# Patient Record
Sex: Male | Born: 1970 | Hispanic: No | Marital: Married | Smoking: Light tobacco smoker
Health system: Southern US, Community
[De-identification: ages and names within clinical notes are randomized; demographics above are authoritative.]

## PROBLEM LIST (undated history)

## (undated) HISTORY — PX: APPENDECTOMY: SHX54

---

## 2003-05-28 ENCOUNTER — Ambulatory Visit (HOSPITAL_COMMUNITY): Admission: EM | Admit: 2003-05-28 | Discharge: 2003-05-28 | Payer: Self-pay | Admitting: Emergency Medicine

## 2016-04-27 ENCOUNTER — Emergency Department (HOSPITAL_COMMUNITY)
Admission: EM | Admit: 2016-04-27 | Discharge: 2016-04-27 | Disposition: A | Discharge status: Home / Self Care | Payer: Worker's Compensation | Attending: Emergency Medicine | Admitting: Emergency Medicine

## 2016-04-27 ENCOUNTER — Emergency Department (HOSPITAL_COMMUNITY): Payer: Worker's Compensation

## 2016-04-27 ENCOUNTER — Encounter (HOSPITAL_COMMUNITY): Payer: Self-pay

## 2016-04-27 DIAGNOSIS — S81012A Laceration without foreign body, left knee, initial encounter: Secondary | ICD-10-CM | POA: Insufficient documentation

## 2016-04-27 DIAGNOSIS — S43014A Anterior dislocation of right humerus, initial encounter: Secondary | ICD-10-CM | POA: Insufficient documentation

## 2016-04-27 DIAGNOSIS — Y92410 Unspecified street and highway as the place of occurrence of the external cause: Secondary | ICD-10-CM | POA: Insufficient documentation

## 2016-04-27 DIAGNOSIS — IMO0002 Reserved for concepts with insufficient information to code with codable children: Secondary | ICD-10-CM

## 2016-04-27 DIAGNOSIS — Y9389 Activity, other specified: Secondary | ICD-10-CM | POA: Insufficient documentation

## 2016-04-27 DIAGNOSIS — F17200 Nicotine dependence, unspecified, uncomplicated: Secondary | ICD-10-CM | POA: Insufficient documentation

## 2016-04-27 DIAGNOSIS — Y990 Civilian activity done for income or pay: Secondary | ICD-10-CM | POA: Diagnosis not present

## 2016-04-27 DIAGNOSIS — R52 Pain, unspecified: Secondary | ICD-10-CM

## 2016-04-27 DIAGNOSIS — S4991XA Unspecified injury of right shoulder and upper arm, initial encounter: Secondary | ICD-10-CM | POA: Diagnosis present

## 2016-04-27 DIAGNOSIS — S43004A Unspecified dislocation of right shoulder joint, initial encounter: Secondary | ICD-10-CM

## 2016-04-27 MED ORDER — HYDROCODONE-ACETAMINOPHEN 5-325 MG PO TABS: 1.0000 | ORAL_TABLET | Freq: Four times a day (QID) | ORAL | 0 refills | Status: AC | PRN

## 2016-04-27 MED ORDER — FENTANYL CITRATE (PF) 100 MCG/2ML IJ SOLN
100.0000 ug | Freq: Once | INTRAMUSCULAR | Status: AC
  Administered 2016-04-27: 100 ug via INTRAVENOUS

## 2016-04-27 MED ORDER — PROPOFOL 10 MG/ML IV BOLUS
INTRAVENOUS | Status: AC | PRN
  Administered 2016-04-27: 40.8 mg via INTRAVENOUS

## 2016-04-27 MED ORDER — FENTANYL CITRATE (PF) 100 MCG/2ML IJ SOLN
100.0000 ug | Freq: Once | INTRAMUSCULAR | Status: AC
  Administered 2016-04-27: 100 ug via INTRAVENOUS
  Filled 2016-04-27: qty 2

## 2016-04-27 MED ORDER — IBUPROFEN 800 MG PO TABS: 800.0000 mg | ORAL_TABLET | Freq: Three times a day (TID) | ORAL | 0 refills | Status: AC

## 2016-04-27 MED ORDER — FENTANYL CITRATE (PF) 100 MCG/2ML IJ SOLN
100.0000 ug | Freq: Once | INTRAMUSCULAR | Status: DC
  Filled 2016-04-27: qty 2

## 2016-04-27 MED ORDER — PROPOFOL 10 MG/ML IV BOLUS
0.5000 mg/kg | Freq: Once | INTRAVENOUS | Status: DC
  Filled 2016-04-27: qty 20

## 2016-04-27 NOTE — ED Notes (Signed)
Pt transported to xray 

## 2016-04-27 NOTE — ED Notes (Signed)
Patient transported to X-ray 

## 2016-04-27 NOTE — ED Notes (Signed)
Airway cart at bedside, patient placed on zoll monitoring, end tidal co2 and suction set up in preparation for conscious sedation.
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
## 2016-04-27 NOTE — ED Notes (Signed)
Pt returned from Ct scan. Ct tech unable to perform ct scan due to pt being unable to put shoulder down. Dr. Jeraldine LootsLockwood made aware.

## 2016-04-27 NOTE — ED Notes (Signed)
DR lockwood at bedside to explain procedure, consent signed by patient.

## 2016-04-27 NOTE — ED Notes (Signed)
Pt transported to CT ?
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
## 2016-04-27 NOTE — Sedation Documentation (Signed)
Patient's shoulder reduced and back in place.

## 2016-04-27 NOTE — ED Notes (Signed)
Pt returned from CT, placed back on monitor.

## 2016-04-27 NOTE — ED Notes (Signed)
Pt ambulated in hall with stand-by assist.

## 2016-12-16 ENCOUNTER — Ambulatory Visit (INDEPENDENT_AMBULATORY_CARE_PROVIDER_SITE_OTHER): Payer: Worker's Compensation | Admitting: Emergency Medicine

## 2016-12-16 ENCOUNTER — Ambulatory Visit (INDEPENDENT_AMBULATORY_CARE_PROVIDER_SITE_OTHER): Payer: Worker's Compensation

## 2016-12-16 VITALS — BP 138/92 | HR 84 | Temp 99.2°F | Resp 16 | Ht 64.5 in | Wt 196.8 lb

## 2016-12-16 DIAGNOSIS — Z01818 Encounter for other preprocedural examination: Secondary | ICD-10-CM

## 2016-12-16 NOTE — Patient Instructions (Addendum)
     IF you received an x-ray today, you will receive an invoice from [facility]. Please contact [facility] at [phone] with questions or concerns regarding your invoice.   IF you received labwork today, you will receive an invoice from [facility]. Please contact [facility] at [phone] with questions or concerns regarding your invoice.   Our billing staff will not be able to assist you with questions regarding bills from these companies.  You will be contacted with the lab results as soon as they are available. The fastest way to get your results is to activate your My Chart account. Instructions are located on the last page of this paperwork. If you have not heard from us regarding the results in 2 weeks, please contact this office.      Place preoperative clearance patient instructions here.  

## 2016-12-16 NOTE — Progress Notes (Signed)
Subjective:    Douglas Larsen is a 46 y.o. male who presents to the office today for a preoperative consultation at the request of surgeon  who plans on performing right shoulder surgery on as soon as cleared this month April 2018. This consultation is requested for the specific conditions prompting preoperative evaluation (i.e. because of potential affect on operative risk): Marland Kitchen Planned anesthesia: general. The patient has the following known anesthesia issues: none. Patients bleeding risk: no recent abnormal bleeding. Patient does not have objections to receiving blood products if needed.  The following portions of the patient's history were reviewed and updated as appropriate: allergies, current medications, past family history, past medical history, past social history, past surgical history and problem list.  Review of Systems A comprehensive review of systems was negative.    Objective:    BP (!) 138/92   Pulse 84   Temp 99.2 F (37.3 C) (Oral)   Resp 16   Ht 5' 4.5" (1.638 m)   Wt 196 lb 12.8 oz (89.3 kg)   SpO2 95%   BMI 33.26 kg/m   General Appearance:    Alert, cooperative, no distress, appears stated age  Head:    Normocephalic, without obvious abnormality, atraumatic  Eyes:    PERRL, conjunctiva/corneas clear, EOM's intact, fundi    benign, both eyes       Ears:    Normal TM's and external ear canals, both ears  Nose:   Nares normal, septum midline, mucosa normal, no drainage    or sinus tenderness  Throat:   Lips, mucosa, and tongue normal; teeth and gums normal  Neck:   Supple, symmetrical, trachea midline, no adenopathy;       thyroid:  No enlargement/tenderness/nodules; no carotid   bruit or JVD  Back:     Symmetric, no curvature, ROM normal, no CVA tenderness  Lungs:     Clear to auscultation bilaterally, respirations unlabored  Chest wall:    No tenderness or deformity  Heart:    Regular rate and rhythm, S1 and S2 normal, no murmur, rub   or gallop   Abdomen:     Soft, non-tender, bowel sounds active all four quadrants,    no masses, no organomegaly        Extremities:   Right shoulder: LROM; rest of Extremities normal, atraumatic, no cyanosis or edema  Pulses:   2+ and symmetric all extremities  Skin:   Skin color, texture, turgor normal, no rashes or lesions  Lymph nodes:   Cervical, supraclavicular, and axillary nodes normal  Neurologic:   CNII-XII intact. Normal strength, sensation and reflexes      throughout    Predictors of intubation difficulty:  Morbid obesity? no  Anatomically abnormal facies? no  Prominent incisors? no  Receding mandible? no  Short, thick neck? no  Neck range of motion: normal   Cardiographics  ECG: normal sinus rhythm, no blocks or conduction defects, no ischemic changes Echocardiogram: not done  Imaging Chest x-ray: normal   Lab Review  No results found for: CHOL, TRIG, HDL, LDLDIRECT    Assessment:      46 y.o. male with planned surgery as above.   Known risk factors for perioperative complications: None   Difficulty with intubation is not anticipated.  Cardiac Risk Estimation:low risk  Current medications which may produce withdrawal symptoms if withheld perioperatively: none     Plan:    1. Preoperative workup done and completed. 2. Change in medication regimen before surgery: not  applicable, not on any medications. 3. Prophylaxis for cardiac events with perioperative beta-blockers: not indicated. 4. Invasive hemodynamic monitoring perioperatively: at the discretion of anesthesiologist. 5. Deep vein thrombosis prophylaxis postoperatively:regimen to be chosen by surgical team. 6. Surveillance for postoperative MI with ECG immediately postoperatively and on postoperative days 1 and 2 AND troponin levels 24 hours postoperatively and on day 4 or hospital discharge (whichever comes first): at the discretion of anesthesiologist.

## 2016-12-17 LAB — CBC WITH DIFFERENTIAL/PLATELET
BASOS ABS: 0 10*3/uL (ref 0.0–0.2)
Basos: 0 %
EOS (ABSOLUTE): 1 10*3/uL — AB (ref 0.0–0.4)
Eos: 12 %
HEMOGLOBIN: 16.2 g/dL (ref 13.0–17.7)
Hematocrit: 47.1 % (ref 37.5–51.0)
IMMATURE GRANULOCYTES: 0 %
Immature Grans (Abs): 0 10*3/uL (ref 0.0–0.1)
LYMPHS: 29 %
Lymphocytes Absolute: 2.6 10*3/uL (ref 0.7–3.1)
MCH: 30.3 pg (ref 26.6–33.0)
MCHC: 34.4 g/dL (ref 31.5–35.7)
MCV: 88 fL (ref 79–97)
Monocytes Absolute: 0.6 10*3/uL (ref 0.1–0.9)
Monocytes: 7 %
NEUTROS PCT: 52 %
Neutrophils Absolute: 4.6 10*3/uL (ref 1.4–7.0)
PLATELETS: 319 10*3/uL (ref 150–379)
RBC: 5.34 x10E6/uL (ref 4.14–5.80)
RDW: 14.3 % (ref 12.3–15.4)
WBC: 9 10*3/uL (ref 3.4–10.8)

## 2016-12-17 LAB — COMPREHENSIVE METABOLIC PANEL
ALK PHOS: 75 IU/L (ref 39–117)
ALT: 42 IU/L (ref 0–44)
AST: 25 IU/L (ref 0–40)
Albumin/Globulin Ratio: 1.4 (ref 1.2–2.2)
Albumin: 4.3 g/dL (ref 3.5–5.5)
BUN/Creatinine Ratio: 16 (ref 9–20)
BUN: 14 mg/dL (ref 6–24)
CHLORIDE: 99 mmol/L (ref 96–106)
CO2: 24 mmol/L (ref 18–29)
Calcium: 9.3 mg/dL (ref 8.7–10.2)
Creatinine, Ser: 0.88 mg/dL (ref 0.76–1.27)
GFR calc non Af Amer: 103 mL/min/{1.73_m2} (ref >59)
GFR, EST AFRICAN AMERICAN: 119 mL/min/{1.73_m2} (ref >59)
GLUCOSE: 107 mg/dL — AB (ref 65–99)
Globulin, Total: 3 g/dL (ref 1.5–4.5)
POTASSIUM: 4.6 mmol/L (ref 3.5–5.2)
Sodium: 139 mmol/L (ref 134–144)
TOTAL PROTEIN: 7.3 g/dL (ref 6.0–8.5)

## 2016-12-17 LAB — HEMOGLOBIN A1C
ESTIMATED AVERAGE GLUCOSE: 123 mg/dL
HEMOGLOBIN A1C: 5.9 % — AB (ref 4.8–5.6)

## 2016-12-17 LAB — PROTIME-INR
INR: 1 (ref 0.8–1.2)
Prothrombin Time: 10.2 s (ref 9.1–12.0)

## 2017-01-17 NOTE — Addendum Note (Signed)
Addended by: Evie LacksSAGARDIA, Williom Cedar J on: 01/17/2017 04:49 PM   Modules accepted: Level of Service

## 2018-12-15 IMAGING — DX DG CHEST 2V
2 series · 2 of 2 positions shown · non-contrast
Comparison: None.

CLINICAL DATA: Preop clearance

EXAM:
CHEST  2 VIEW

[chest pa]
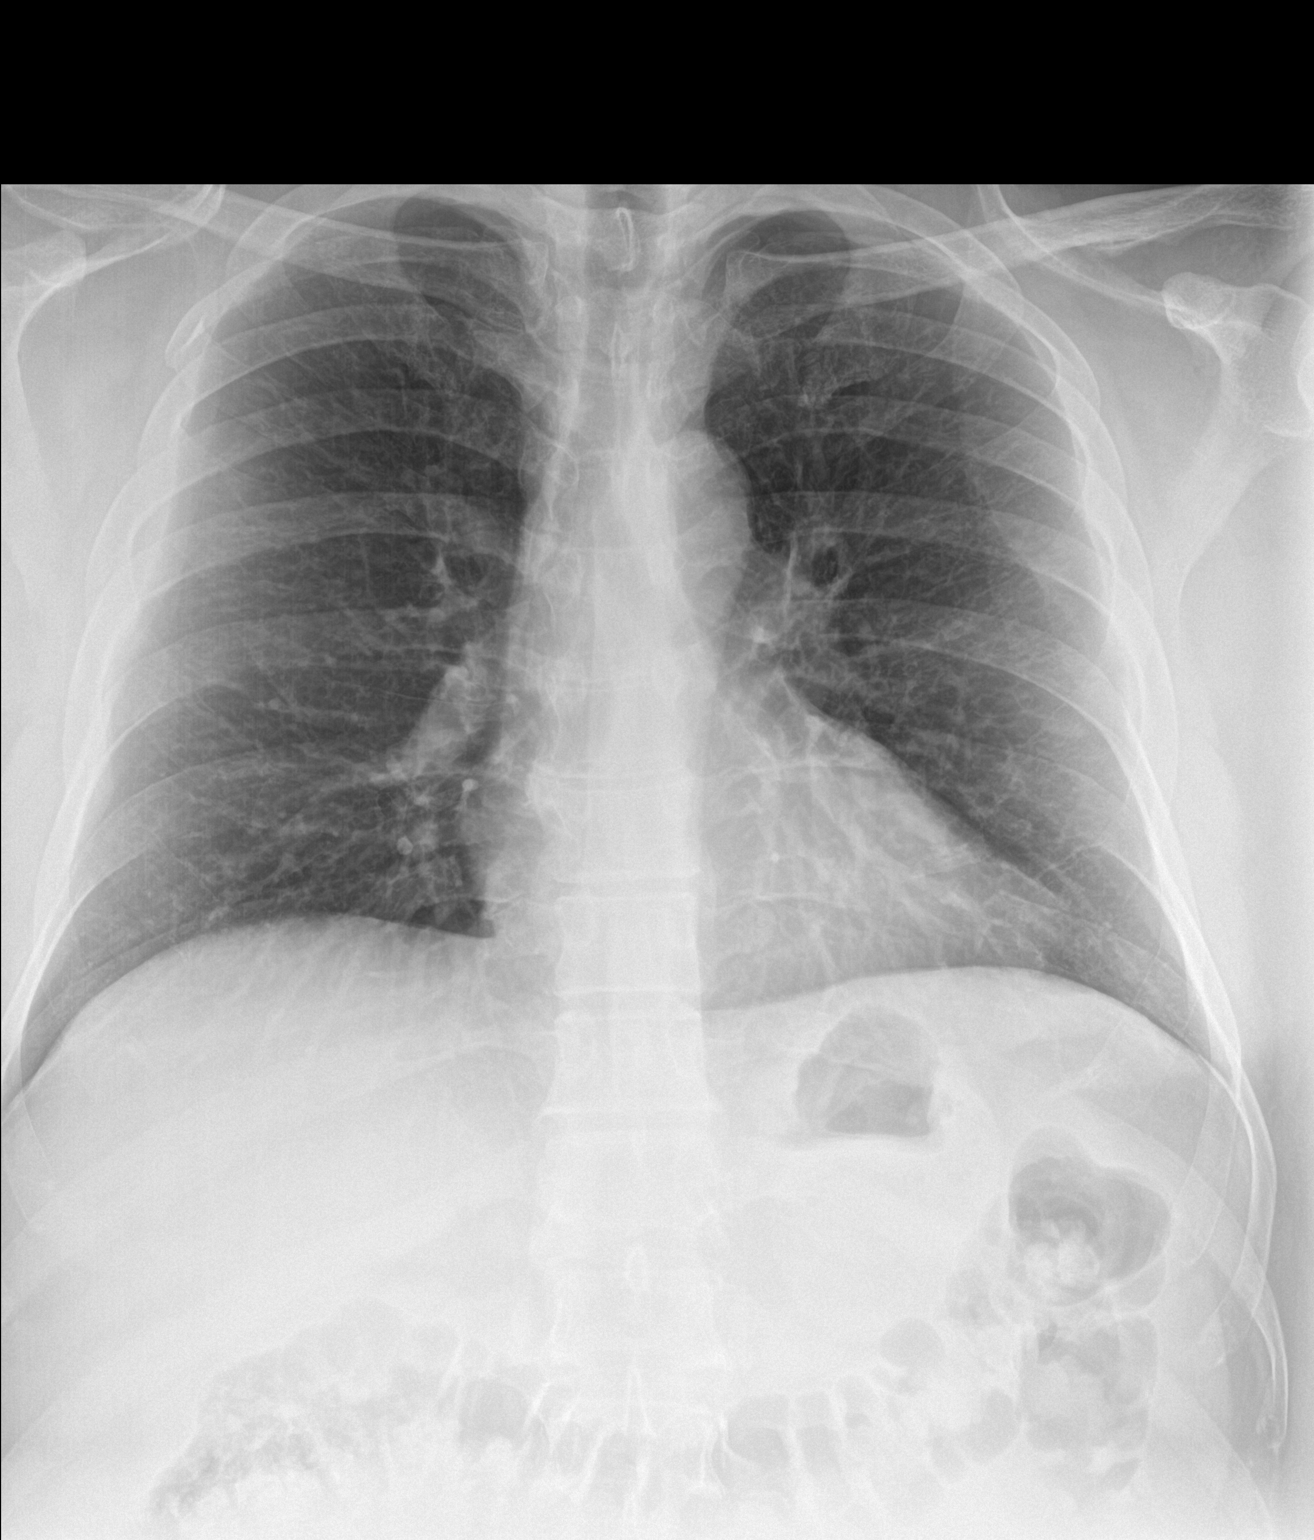

[chest lat]
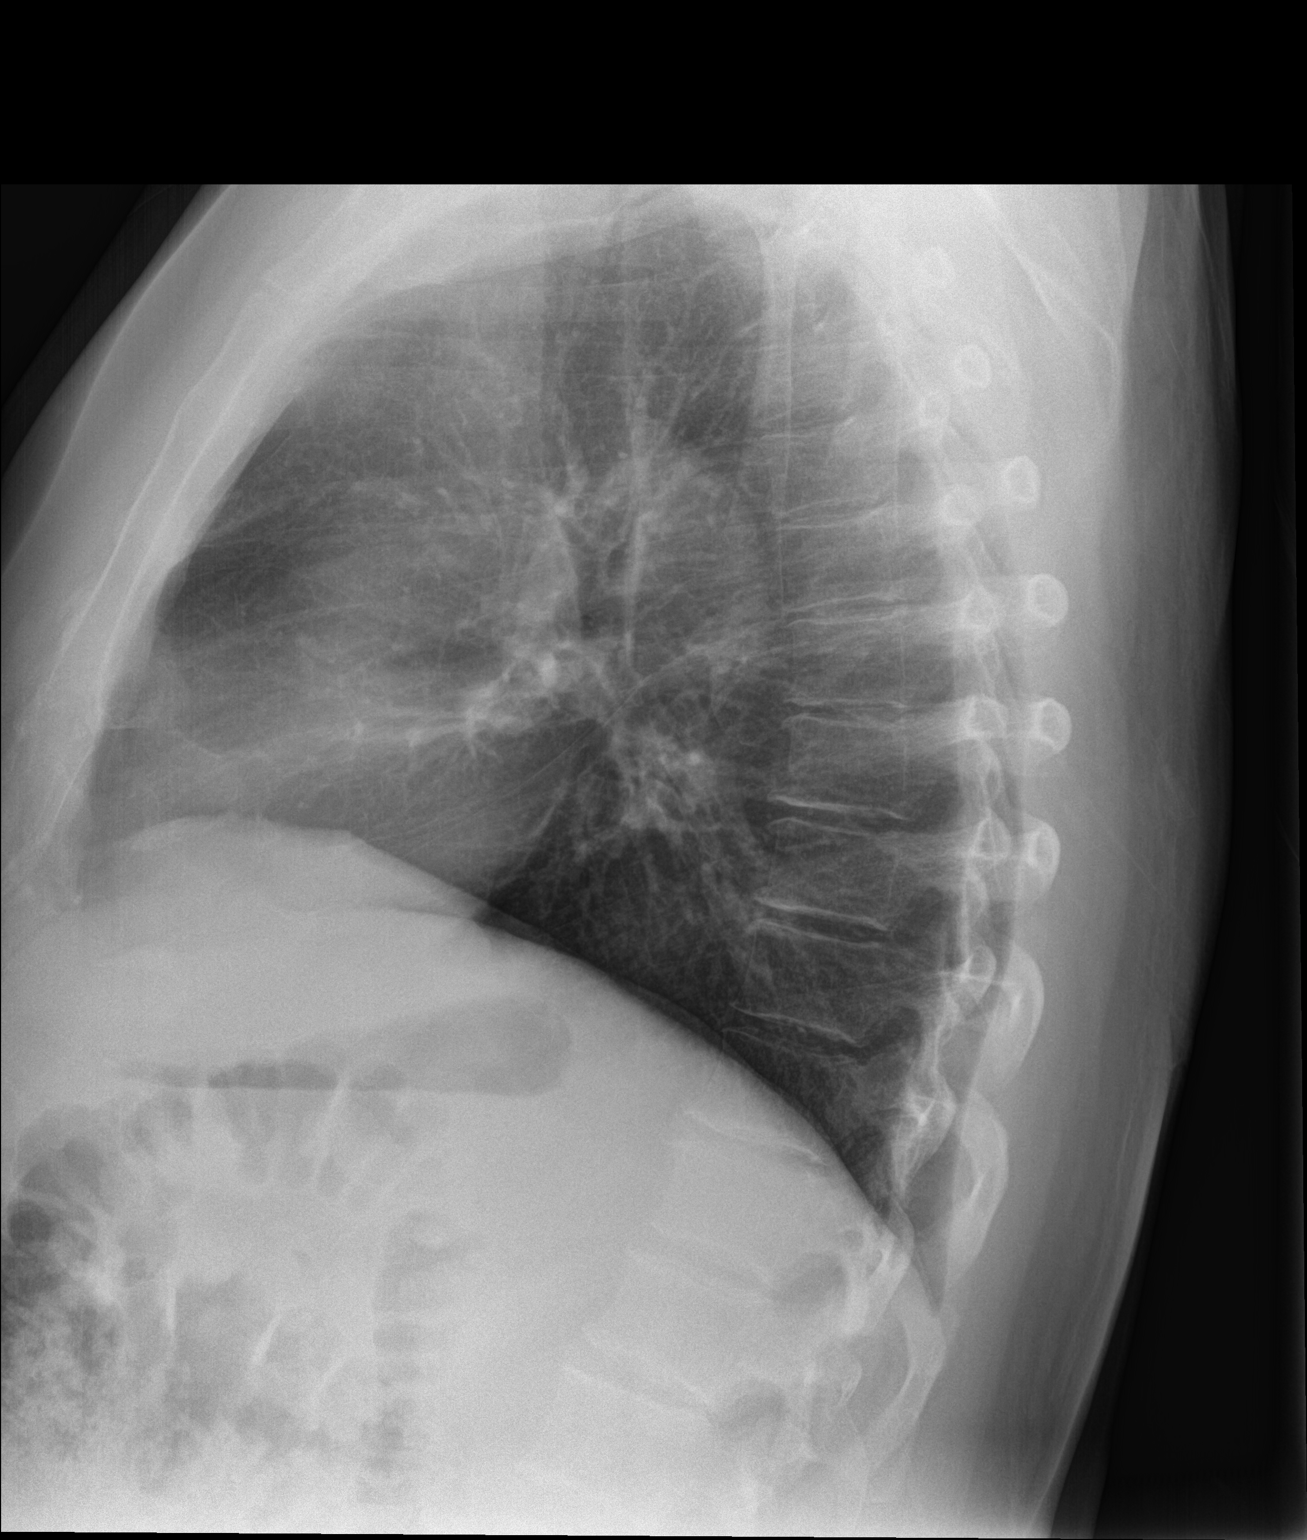

[2 of 2 positions shown; findings below may reference images not displayed]

FINDINGS: Cardiomediastinal silhouette is unremarkable. No infiltrate or
pleural effusion. No pulmonary edema. Bony thorax is unremarkable.
IMPRESSION: No active cardiopulmonary disease.
